# Patient Record
Sex: Male | Born: 1975 | Race: Asian | Hispanic: No | Marital: Married | Smoking: Never smoker
Health system: Southern US, Community
[De-identification: ages and names within clinical notes are randomized; demographics above are authoritative.]

## PROBLEM LIST (undated history)

## (undated) DIAGNOSIS — E079 Disorder of thyroid, unspecified: Secondary | ICD-10-CM

---

## 2015-10-05 ENCOUNTER — Emergency Department (HOSPITAL_BASED_OUTPATIENT_CLINIC_OR_DEPARTMENT_OTHER): Payer: No Typology Code available for payment source

## 2015-10-05 ENCOUNTER — Emergency Department (HOSPITAL_BASED_OUTPATIENT_CLINIC_OR_DEPARTMENT_OTHER)
Admission: EM | Admit: 2015-10-05 | Discharge: 2015-10-05 | Disposition: A | Discharge status: Home / Self Care | Payer: No Typology Code available for payment source | Attending: Emergency Medicine | Admitting: Emergency Medicine

## 2015-10-05 ENCOUNTER — Encounter (HOSPITAL_BASED_OUTPATIENT_CLINIC_OR_DEPARTMENT_OTHER): Payer: Self-pay | Admitting: Emergency Medicine

## 2015-10-05 DIAGNOSIS — Y92410 Unspecified street and highway as the place of occurrence of the external cause: Secondary | ICD-10-CM | POA: Insufficient documentation

## 2015-10-05 DIAGNOSIS — Y999 Unspecified external cause status: Secondary | ICD-10-CM | POA: Diagnosis not present

## 2015-10-05 DIAGNOSIS — Z79899 Other long term (current) drug therapy: Secondary | ICD-10-CM | POA: Diagnosis not present

## 2015-10-05 DIAGNOSIS — S39012A Strain of muscle, fascia and tendon of lower back, initial encounter: Secondary | ICD-10-CM | POA: Diagnosis not present

## 2015-10-05 DIAGNOSIS — Y9389 Activity, other specified: Secondary | ICD-10-CM | POA: Diagnosis not present

## 2015-10-05 DIAGNOSIS — S299XXA Unspecified injury of thorax, initial encounter: Secondary | ICD-10-CM | POA: Diagnosis present

## 2015-10-05 DIAGNOSIS — S29012A Strain of muscle and tendon of back wall of thorax, initial encounter: Secondary | ICD-10-CM

## 2015-10-05 HISTORY — DX: Disorder of thyroid, unspecified: E07.9

## 2015-10-05 MED ORDER — CYCLOBENZAPRINE HCL 10 MG PO TABS: 10.0000 mg | ORAL_TABLET | Freq: Three times a day (TID) | ORAL | 0 refills | Status: AC | PRN

## 2015-10-05 MED ORDER — KETOROLAC TROMETHAMINE 60 MG/2ML IM SOLN
60.0000 mg | Freq: Once | INTRAMUSCULAR | Status: AC
  Administered 2015-10-05: 60 mg via INTRAMUSCULAR
  Filled 2015-10-05: qty 2
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
## 2016-09-03 IMAGING — CR DG THORACIC SPINE 3V
3 series · 3 of 3 positions shown · non-contrast
Comparison: None.

CLINICAL DATA: Upper back pain.  Muscle strain.

EXAM:
THORACIC SPINE - 3 VIEWS

[t t-spine a.p.]
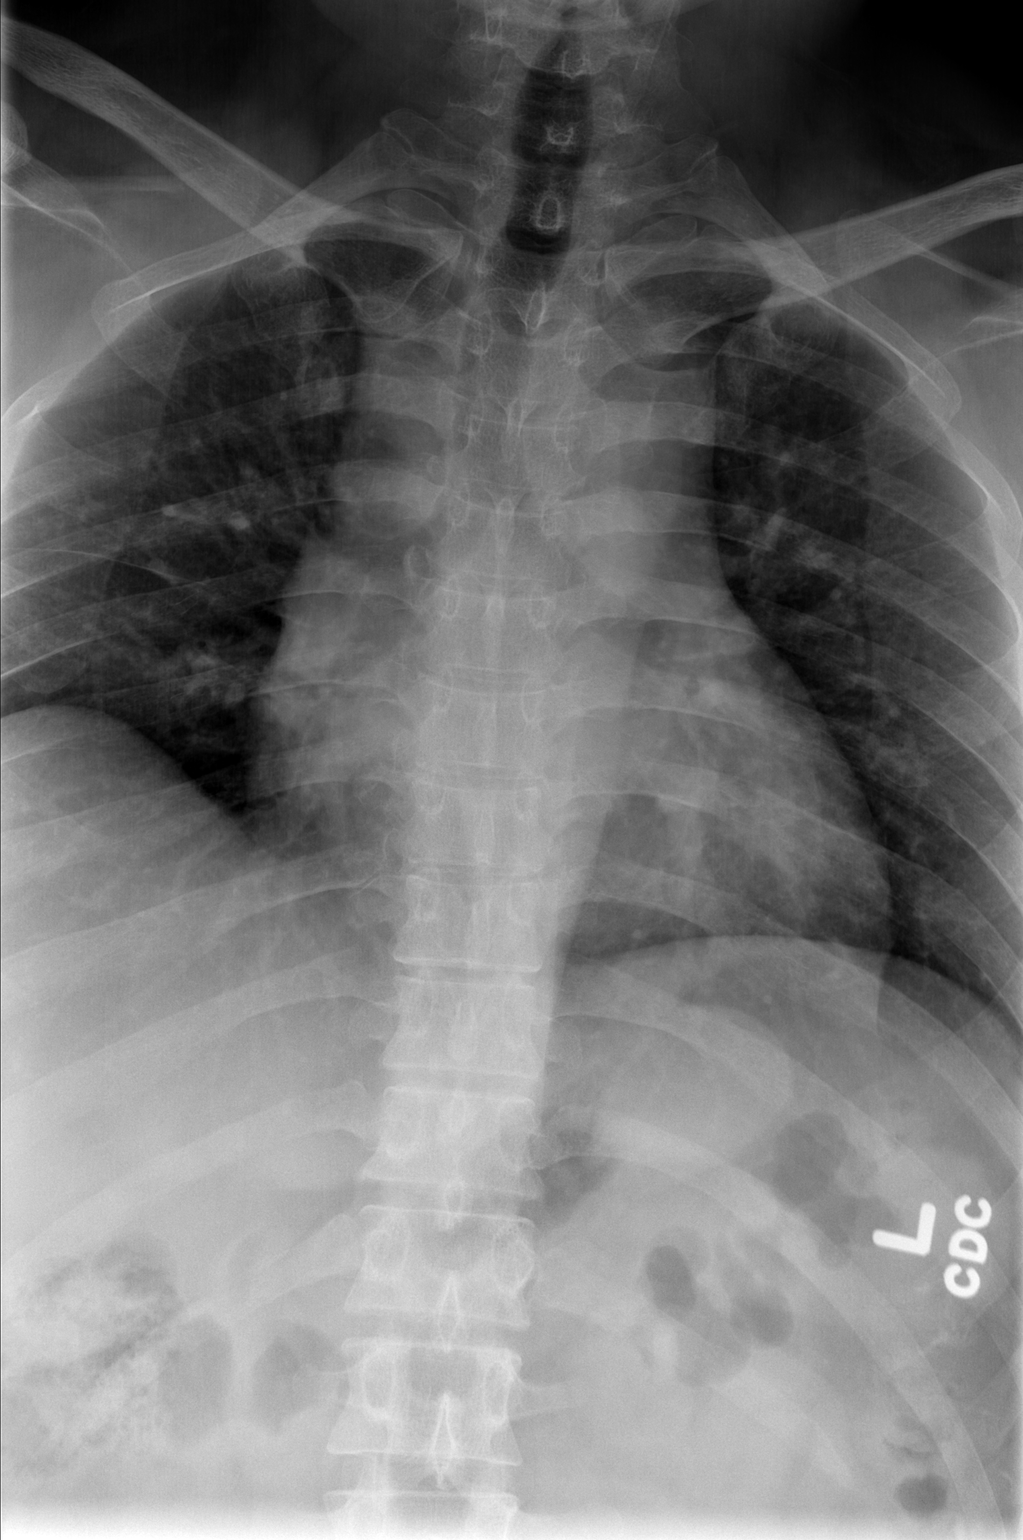

[t t-spine lat]
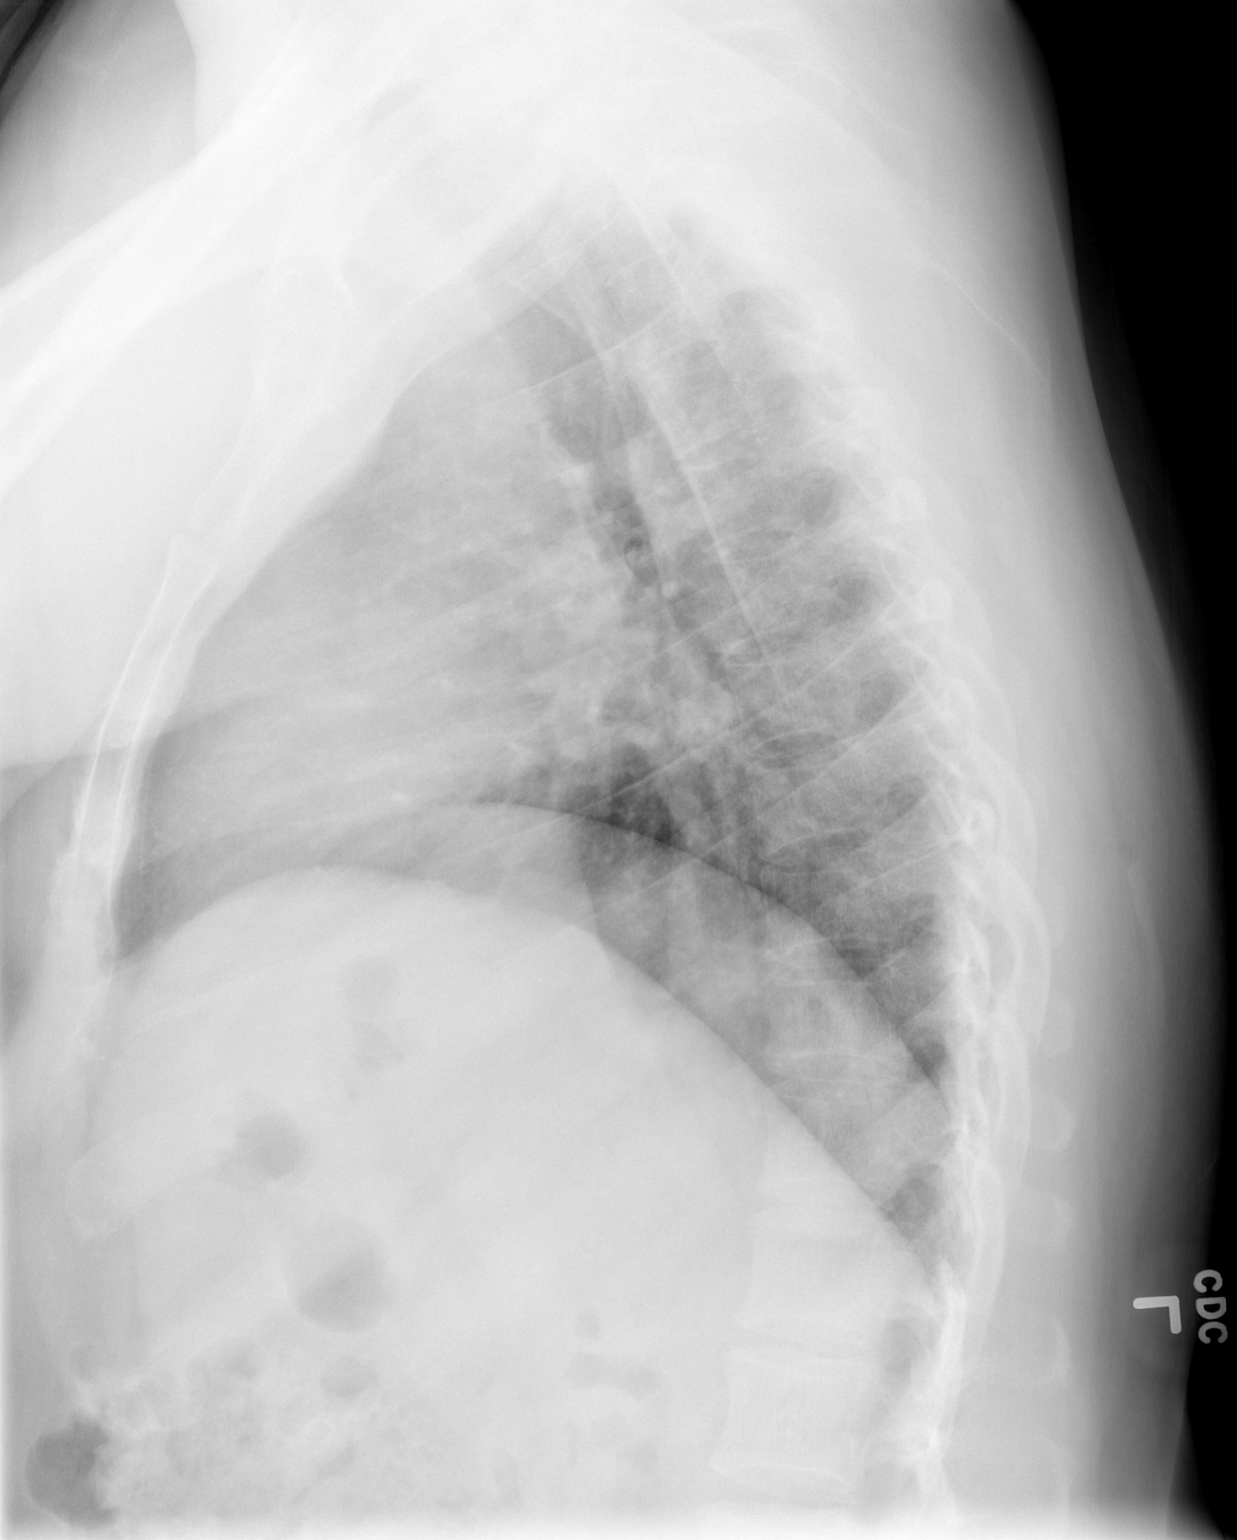

[t swimmers]
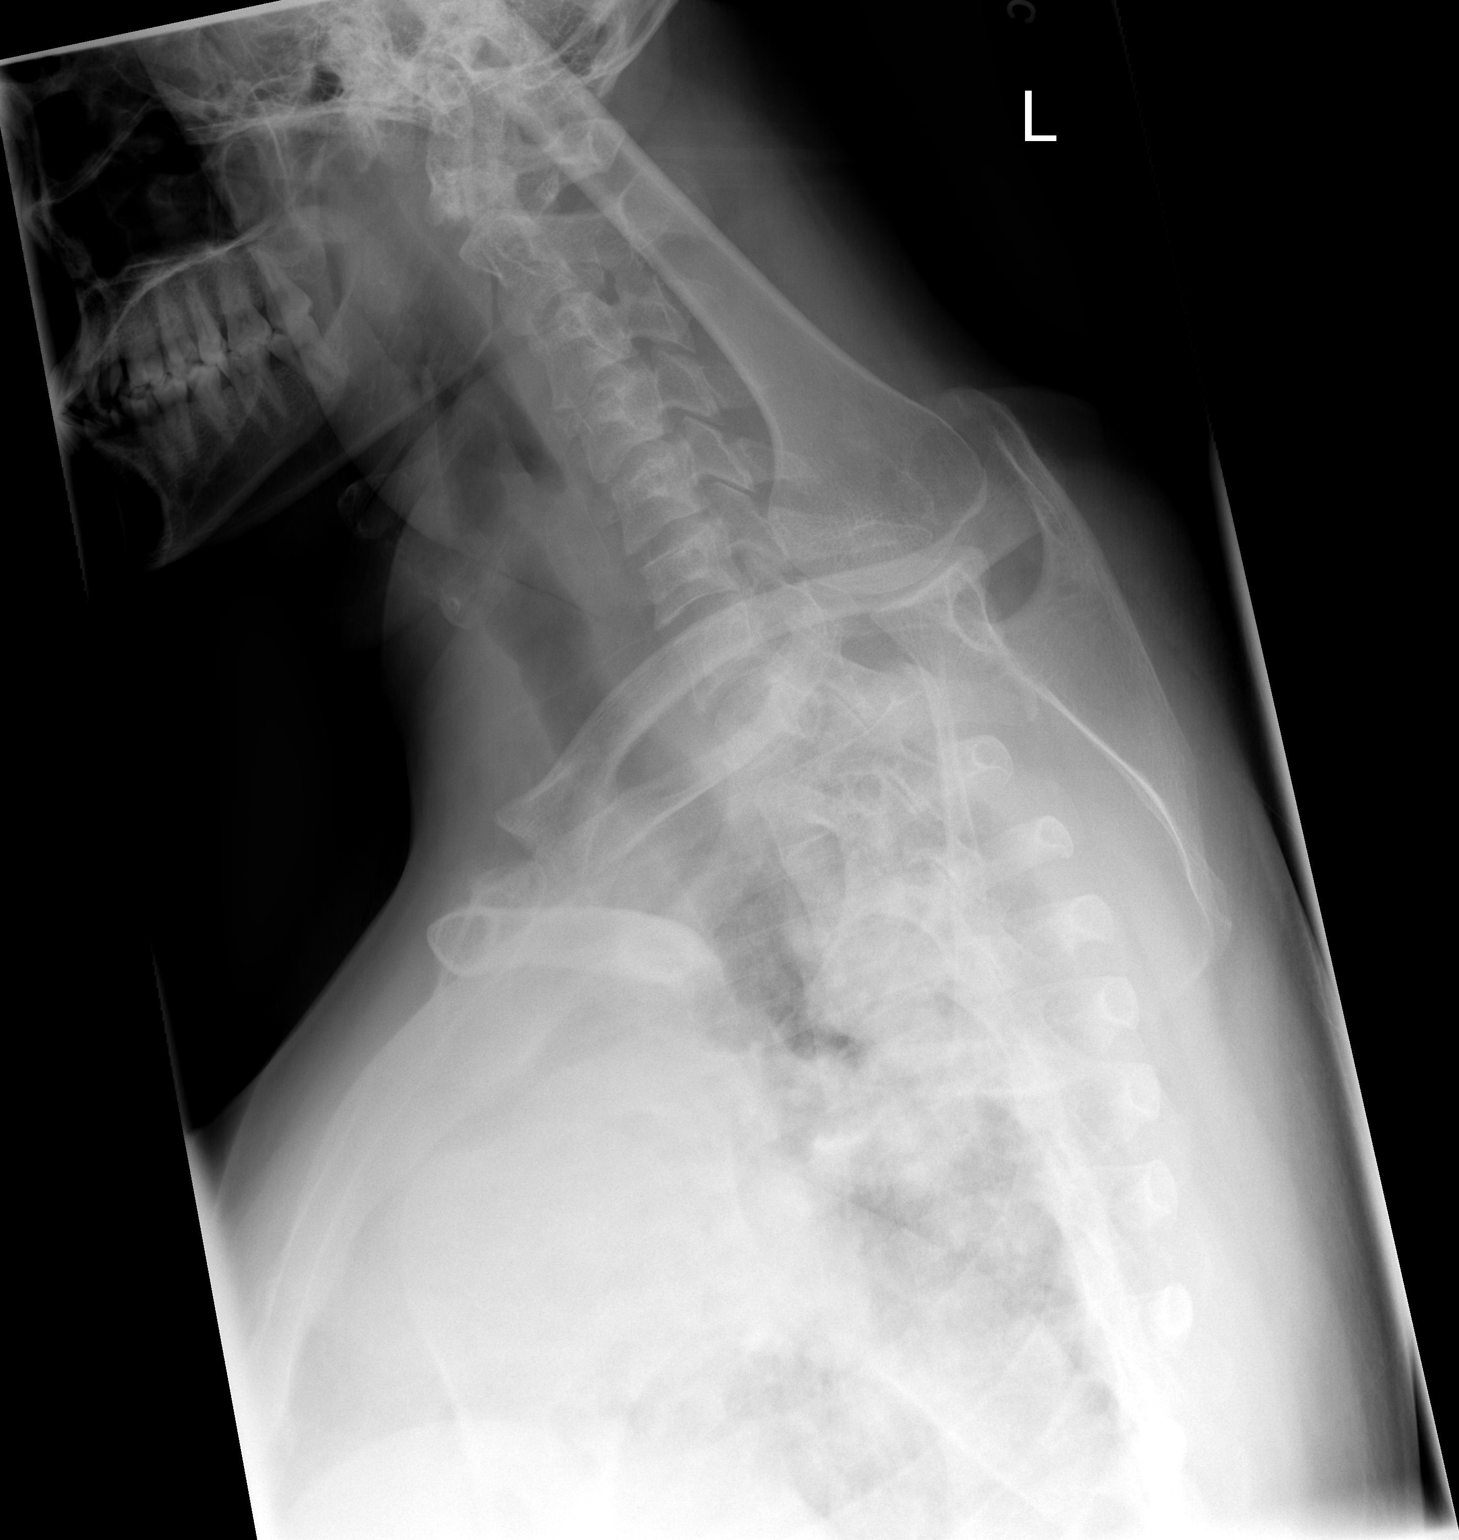

[3 of 3 positions shown; findings below may reference images not displayed]

FINDINGS: Anatomic alignment. No vertebral compression deformity. No definite
fracture.
IMPRESSION: No acute bony pathology.
